# Patient Record
Sex: Female | Born: 1980 | Race: Asian | Hispanic: No | Marital: Married | Smoking: Never smoker
Health system: Southern US, Community
[De-identification: ages and names within clinical notes are randomized; demographics above are authoritative.]

## PROBLEM LIST (undated history)

## (undated) DIAGNOSIS — F809 Developmental disorder of speech and language, unspecified: Secondary | ICD-10-CM

## (undated) DIAGNOSIS — H9190 Unspecified hearing loss, unspecified ear: Secondary | ICD-10-CM

## (undated) DIAGNOSIS — R479 Unspecified speech disturbances: Secondary | ICD-10-CM

## (undated) HISTORY — PX: NO PAST SURGERIES: SHX2092

## (undated) HISTORY — DX: Unspecified hearing loss, unspecified ear: H91.90

---

## 2011-03-02 ENCOUNTER — Other Ambulatory Visit: Payer: Self-pay | Admitting: Otolaryngology

## 2011-03-29 ENCOUNTER — Ambulatory Visit
Admission: RE | Admit: 2011-03-29 | Discharge: 2011-03-29 | Disposition: A | Discharge status: Home / Self Care | Payer: Medicaid Other | Source: Ambulatory Visit | Attending: Otolaryngology | Admitting: Otolaryngology

## 2011-03-29 MED ORDER — GADOBENATE DIMEGLUMINE 529 MG/ML IV SOLN
9.0000 mL | Freq: Once | INTRAVENOUS | Status: AC | PRN
  Administered 2011-03-29: 9 mL via INTRAVENOUS

## 2011-06-29 NOTE — L&D Delivery Note (Signed)
Delivery Note At 12:12 AM a viable female was delivered via Vaginal, Spontaneous Delivery (Presentation: ; Occiput Anterior).  APGAR: 9, 9; weight 6 lb 4.3 oz (2843 g).   Placenta status: Intact, Spontaneous.  Cord: 3 vessels with the following complications: None.   Anesthesia: None  Episiotomy: None Lacerations: None Suture Repair: n/a Est. Blood Loss (mL):   Mom to postpartum.  Baby to nursery-stable.  CRESENZO-DISHMAN,Chelli Yerkes 11/14/2011, 12:26 AM

## 2011-08-18 ENCOUNTER — Other Ambulatory Visit (HOSPITAL_COMMUNITY): Payer: Self-pay | Admitting: Nurse Practitioner

## 2011-08-18 DIAGNOSIS — Z3689 Encounter for other specified antenatal screening: Secondary | ICD-10-CM

## 2011-08-18 LAB — OB RESULTS CONSOLE ABO/RH: RH Type: POSITIVE

## 2011-08-18 LAB — OB RESULTS CONSOLE RUBELLA ANTIBODY, IGM: Rubella: IMMUNE

## 2011-08-18 LAB — OB RESULTS CONSOLE RPR: RPR: NONREACTIVE

## 2011-08-24 ENCOUNTER — Ambulatory Visit (HOSPITAL_COMMUNITY)
Admission: RE | Admit: 2011-08-24 | Discharge: 2011-08-24 | Disposition: A | Discharge status: Home / Self Care | Payer: Medicaid Other | Source: Ambulatory Visit | Attending: Nurse Practitioner | Admitting: Nurse Practitioner

## 2011-08-24 DIAGNOSIS — Z363 Encounter for antenatal screening for malformations: Secondary | ICD-10-CM | POA: Insufficient documentation

## 2011-08-24 DIAGNOSIS — Z3689 Encounter for other specified antenatal screening: Secondary | ICD-10-CM

## 2011-08-24 DIAGNOSIS — Z1389 Encounter for screening for other disorder: Secondary | ICD-10-CM | POA: Insufficient documentation

## 2011-08-24 DIAGNOSIS — O0930 Supervision of pregnancy with insufficient antenatal care, unspecified trimester: Secondary | ICD-10-CM | POA: Insufficient documentation

## 2011-08-24 DIAGNOSIS — O358XX0 Maternal care for other (suspected) fetal abnormality and damage, not applicable or unspecified: Secondary | ICD-10-CM | POA: Insufficient documentation

## 2011-09-22 ENCOUNTER — Other Ambulatory Visit (HOSPITAL_COMMUNITY): Payer: Self-pay | Admitting: Physician Assistant

## 2011-09-22 DIAGNOSIS — Z1389 Encounter for screening for other disorder: Secondary | ICD-10-CM

## 2011-09-24 ENCOUNTER — Ambulatory Visit (HOSPITAL_COMMUNITY)
Admission: RE | Admit: 2011-09-24 | Discharge: 2011-09-24 | Disposition: A | Discharge status: Home / Self Care | Payer: Medicaid Other | Source: Ambulatory Visit | Attending: Physician Assistant | Admitting: Physician Assistant

## 2011-09-24 DIAGNOSIS — Z3689 Encounter for other specified antenatal screening: Secondary | ICD-10-CM | POA: Insufficient documentation

## 2011-09-24 DIAGNOSIS — O0930 Supervision of pregnancy with insufficient antenatal care, unspecified trimester: Secondary | ICD-10-CM | POA: Insufficient documentation

## 2011-09-24 DIAGNOSIS — Z1389 Encounter for screening for other disorder: Secondary | ICD-10-CM

## 2011-09-24 DIAGNOSIS — O99891 Other specified diseases and conditions complicating pregnancy: Secondary | ICD-10-CM | POA: Insufficient documentation

## 2011-10-06 ENCOUNTER — Other Ambulatory Visit (HOSPITAL_COMMUNITY): Payer: Self-pay | Admitting: Physician Assistant

## 2011-10-08 ENCOUNTER — Ambulatory Visit (HOSPITAL_COMMUNITY): Payer: Medicaid Other

## 2011-10-15 ENCOUNTER — Ambulatory Visit (HOSPITAL_COMMUNITY)
Admission: RE | Admit: 2011-10-15 | Discharge: 2011-10-15 | Disposition: A | Discharge status: Home / Self Care | Payer: Medicaid Other | Source: Ambulatory Visit | Attending: Physician Assistant | Admitting: Physician Assistant

## 2011-10-15 DIAGNOSIS — O365990 Maternal care for other known or suspected poor fetal growth, unspecified trimester, not applicable or unspecified: Secondary | ICD-10-CM | POA: Insufficient documentation

## 2011-10-15 DIAGNOSIS — Z3689 Encounter for other specified antenatal screening: Secondary | ICD-10-CM | POA: Insufficient documentation

## 2011-11-08 ENCOUNTER — Inpatient Hospital Stay (HOSPITAL_COMMUNITY): Admission: AD | Admit: 2011-11-08 | Payer: Self-pay | Source: Ambulatory Visit | Admitting: Family Medicine

## 2011-11-13 ENCOUNTER — Inpatient Hospital Stay (HOSPITAL_COMMUNITY)
Admission: AD | Admit: 2011-11-13 | Discharge: 2011-11-16 | DRG: 775 | Disposition: A | Discharge status: Home / Self Care | Payer: Medicaid Other | Source: Ambulatory Visit | Attending: Obstetrics & Gynecology | Admitting: Obstetrics & Gynecology

## 2011-11-13 ENCOUNTER — Encounter (HOSPITAL_COMMUNITY): Payer: Self-pay

## 2011-11-13 DIAGNOSIS — IMO0001 Reserved for inherently not codable concepts without codable children: Secondary | ICD-10-CM

## 2011-11-13 DIAGNOSIS — O99892 Other specified diseases and conditions complicating childbirth: Principal | ICD-10-CM | POA: Diagnosis present

## 2011-11-13 DIAGNOSIS — H913 Deaf nonspeaking, not elsewhere classified: Secondary | ICD-10-CM | POA: Diagnosis present

## 2011-11-13 LAB — CBC
Hemoglobin: 12.6 g/dL (ref 12.0–15.0)
MCH: 29.4 pg (ref 26.0–34.0)
Platelets: 194 10*3/uL (ref 150–400)
RBC: 4.29 MIL/uL (ref 3.87–5.11)
WBC: 8.1 10*3/uL (ref 4.0–10.5)

## 2011-11-13 MED ORDER — ONDANSETRON HCL 4 MG/2ML IJ SOLN: 4.0000 mg | Freq: Four times a day (QID) | INTRAMUSCULAR | Status: DC | PRN

## 2011-11-13 MED ORDER — ACETAMINOPHEN 325 MG PO TABS: 650.0000 mg | ORAL_TABLET | ORAL | Status: DC | PRN

## 2011-11-13 MED ORDER — FENTANYL CITRATE 0.05 MG/ML IJ SOLN
50.0000 ug | INTRAMUSCULAR | Status: DC | PRN
  Administered 2011-11-13: 50 ug via INTRAVENOUS
  Filled 2011-11-13: qty 2

## 2011-11-13 MED ORDER — LACTATED RINGERS IV SOLN: 500.0000 mL | INTRAVENOUS | Status: DC | PRN

## 2011-11-13 MED ORDER — HYDROXYZINE HCL 50 MG/ML IM SOLN: 50.0000 mg | Freq: Four times a day (QID) | INTRAMUSCULAR | Status: DC | PRN

## 2011-11-13 MED ORDER — LACTATED RINGERS IV SOLN: INTRAVENOUS | Status: DC

## 2011-11-13 MED ORDER — OXYTOCIN 20 UNITS IN LACTATED RINGERS INFUSION - SIMPLE
125.0000 mL/h | Freq: Once | INTRAVENOUS | Status: AC
  Administered 2011-11-14: 500 mL/h via INTRAVENOUS

## 2011-11-13 MED ORDER — FLEET ENEMA 7-19 GM/118ML RE ENEM: 1.0000 | ENEMA | RECTAL | Status: DC | PRN

## 2011-11-13 MED ORDER — LIDOCAINE HCL (PF) 1 % IJ SOLN: 30.0000 mL | INTRAMUSCULAR | Status: DC | PRN

## 2011-11-13 MED ORDER — IBUPROFEN 600 MG PO TABS: 600.0000 mg | ORAL_TABLET | Freq: Four times a day (QID) | ORAL | Status: DC | PRN

## 2011-11-13 MED ORDER — CITRIC ACID-SODIUM CITRATE 334-500 MG/5ML PO SOLN: 30.0000 mL | ORAL | Status: DC | PRN

## 2011-11-13 MED ORDER — HYDROXYZINE HCL 50 MG PO TABS: 50.0000 mg | ORAL_TABLET | Freq: Four times a day (QID) | ORAL | Status: DC | PRN

## 2011-11-13 MED ORDER — OXYCODONE-ACETAMINOPHEN 5-325 MG PO TABS: 1.0000 | ORAL_TABLET | ORAL | Status: DC | PRN

## 2011-11-13 MED ORDER — OXYTOCIN BOLUS FROM INFUSION
500.0000 mL | Freq: Once | INTRAVENOUS | Status: DC
  Filled 2011-11-13: qty 500
  Filled 2011-11-13: qty 1000

## 2011-11-13 NOTE — MAU Note (Signed)
Pt c/o ucs since 1700. Pt deaf and family members speak Nepali.

## 2011-11-13 NOTE — Progress Notes (Signed)
   Samantha Garner is a 31 y.o. G3P2002 at [redacted]w[redacted]d  admitted for active labor  Subjective: Wants some IV pain meds  Objective: BP 121/82  Pulse 80  Temp(Src) 98.9 F (37.2 C) (Oral)  Resp 18  Ht 5\' 2"  (1.575 m)  Wt 70.308 kg (155 lb)  BMI 28.35 kg/m2  SpO2 100%    FHT:  FHR: 130 bpm, variability: moderate,  accelerations:  Present,  decelerations:  Absent UC:   regular, every 4-5 minutes SVE:   Dilation: 6 Effacement (%): 90 Station: -2 Exam by:: F.Cresenzo,CNM  Labs: Lab Results  Component Value Date   WBC 8.1 11/13/2011   HGB 12.6 11/13/2011   HCT 36.5 11/13/2011   MCV 85.1 11/13/2011   PLT 194 11/13/2011   AROM with clear fluid Assessment / Plan: Spontaneous labor, progressing normally  Labor: Progressing normally Fetal Wellbeing:  Category I Pain Control:  Fentanyl Anticipated MOD:  NSVD  CRESENZO-DISHMAN,Kajal Scalici 11/13/2011, 11:25 PM

## 2011-11-13 NOTE — H&P (Signed)
  Samantha Garner is a 31 y.o. female G3P2002 with IUP at [redacted]w[redacted]d presenting for contractions for several hours. Pt states she has been having regular, every 5 minutes contractions, associated with none vaginal bleeding, membranes are intact, with active fetal movement.   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  From [place]  Past Surgical History: Past Surgical History  Procedure Date  . No past surgeries     Obstetrical History: OB History    Grav Para Term Preterm Abortions TAB SAB Ect Mult Living   3 2 2  0 0 0 0 0 0 2      Gynecological History: OB History    Grav Para Term Preterm Abortions TAB SAB Ect Mult Living   3 2 2  0 0 0 0 0 0 2      Social History: History   Social History  . Marital Status: Married    Spouse Name: N/A    Number of Children: N/A  . Years of Education: N/A   Social History Main Topics  . Smoking status: None  . Smokeless tobacco: None  . Alcohol Use:   . Drug Use:   . Sexually Active: Yes   Other Topics Concern  . None   Social History Narrative  . None    Family History: History reviewed. No pertinent family history.  Allergies: Allergies not on file  No prescriptions prior to admission    Review of Systems - Negative except contractions   Blood pressure 119/72, pulse 91, temperature 98.2 F (36.8 C), temperature source Oral, resp. rate 16, height 5\' 2"  (1.575 m), weight 70.308 kg (155 lb), SpO2 100.00%. General appearance: alert, cooperative and no distress Lungs: clear to auscultation bilaterally Heart: regular rate and rhythm Abdomen: soft, non-tender; bowel sounds normal Extremities: Homans sign is negative, no sign of DVT DTR's 2+ Pelvic:  4-5/80/-2/BBOW Presentation: cephalic Fetal monitoringBaseline: 130 bpm, Variability: Good {> 6 bpm), Accelerations: Reactive and Decelerations: Absent Uterine  activity:  Contractions q 3-5 minutes     Prenatal labs: ABO, Rh:  O+ Antibody:  neg Rubella:  immune RPR:   neg HBsAg:   neg HIV:   neg GBS:   neg 1 hr Glucola 58 Genetic screening  Too late Anatomy US normal   Assessment: Samantha Garner is a 31 y.o. [id number] with an IUP at [redacted]w[redacted]d presenting for active labor  Plan: Admit for labor management   CRESENZO-DISHMAN,Kailoni Vahle 11/13/2011, 9:29 PM

## 2011-11-14 ENCOUNTER — Encounter (HOSPITAL_COMMUNITY): Payer: Self-pay | Admitting: *Deleted

## 2011-11-14 DIAGNOSIS — O9989 Other specified diseases and conditions complicating pregnancy, childbirth and the puerperium: Secondary | ICD-10-CM

## 2011-11-14 DIAGNOSIS — H913 Deaf nonspeaking, not elsewhere classified: Secondary | ICD-10-CM

## 2011-11-14 MED ORDER — BENZOCAINE-MENTHOL 20-0.5 % EX AERO: 1.0000 "application " | INHALATION_SPRAY | CUTANEOUS | Status: DC | PRN

## 2011-11-14 MED ORDER — LANOLIN HYDROUS EX OINT: TOPICAL_OINTMENT | CUTANEOUS | Status: DC | PRN

## 2011-11-14 MED ORDER — PRENATAL MULTIVITAMIN CH
1.0000 | ORAL_TABLET | Freq: Every day | ORAL | Status: DC
  Administered 2011-11-14 – 2011-11-16 (×3): 1 via ORAL
  Filled 2011-11-14 (×3): qty 1

## 2011-11-14 MED ORDER — WITCH HAZEL-GLYCERIN EX PADS: 1.0000 "application " | MEDICATED_PAD | CUTANEOUS | Status: DC | PRN

## 2011-11-14 MED ORDER — FERROUS SULFATE 325 (65 FE) MG PO TABS
325.0000 mg | ORAL_TABLET | Freq: Two times a day (BID) | ORAL | Status: DC
  Administered 2011-11-14 – 2011-11-16 (×5): 325 mg via ORAL
  Filled 2011-11-14 (×5): qty 1

## 2011-11-14 MED ORDER — METHYLERGONOVINE MALEATE 0.2 MG/ML IJ SOLN: 0.2000 mg | INTRAMUSCULAR | Status: DC | PRN

## 2011-11-14 MED ORDER — MEASLES, MUMPS & RUBELLA VAC ~~LOC~~ INJ
0.5000 mL | INJECTION | Freq: Once | SUBCUTANEOUS | Status: DC
  Filled 2011-11-14: qty 0.5

## 2011-11-14 MED ORDER — OXYTOCIN 20 UNITS IN LACTATED RINGERS INFUSION - SIMPLE: 125.0000 mL/h | INTRAVENOUS | Status: DC | PRN

## 2011-11-14 MED ORDER — TETANUS-DIPHTH-ACELL PERTUSSIS 5-2.5-18.5 LF-MCG/0.5 IM SUSP: 0.5000 mL | Freq: Once | INTRAMUSCULAR | Status: DC

## 2011-11-14 MED ORDER — IBUPROFEN 600 MG PO TABS
600.0000 mg | ORAL_TABLET | Freq: Four times a day (QID) | ORAL | Status: DC
  Administered 2011-11-14 – 2011-11-16 (×10): 600 mg via ORAL
  Filled 2011-11-14 (×10): qty 1

## 2011-11-14 MED ORDER — SENNOSIDES-DOCUSATE SODIUM 8.6-50 MG PO TABS
2.0000 | ORAL_TABLET | Freq: Every day | ORAL | Status: DC
  Administered 2011-11-14 – 2011-11-15 (×2): 2 via ORAL

## 2011-11-14 MED ORDER — DIPHENHYDRAMINE HCL 25 MG PO CAPS: 25.0000 mg | ORAL_CAPSULE | Freq: Four times a day (QID) | ORAL | Status: DC | PRN

## 2011-11-14 MED ORDER — ONDANSETRON HCL 4 MG/2ML IJ SOLN: 4.0000 mg | INTRAMUSCULAR | Status: DC | PRN

## 2011-11-14 MED ORDER — ONDANSETRON HCL 4 MG PO TABS: 4.0000 mg | ORAL_TABLET | ORAL | Status: DC | PRN

## 2011-11-14 MED ORDER — MAGNESIUM HYDROXIDE 400 MG/5ML PO SUSP: 30.0000 mL | ORAL | Status: DC | PRN

## 2011-11-14 MED ORDER — ZOLPIDEM TARTRATE 5 MG PO TABS: 5.0000 mg | ORAL_TABLET | Freq: Every evening | ORAL | Status: DC | PRN

## 2011-11-14 MED ORDER — OXYCODONE-ACETAMINOPHEN 5-325 MG PO TABS: 1.0000 | ORAL_TABLET | ORAL | Status: DC | PRN

## 2011-11-14 MED ORDER — DIBUCAINE 1 % RE OINT: 1.0000 "application " | TOPICAL_OINTMENT | RECTAL | Status: DC | PRN

## 2011-11-14 MED ORDER — METHYLERGONOVINE MALEATE 0.2 MG PO TABS: 0.2000 mg | ORAL_TABLET | ORAL | Status: DC | PRN

## 2011-11-14 MED ORDER — SIMETHICONE 80 MG PO CHEW: 80.0000 mg | CHEWABLE_TABLET | ORAL | Status: DC | PRN

## 2011-11-14 NOTE — Progress Notes (Signed)
Pt transferred to room 122 via wheelchair, mother in law at side.

## 2011-11-14 NOTE — Progress Notes (Signed)
Patient ID: Samantha Garner, female   DOB: [birth year], 31 y.o.   MRN: [id number] Attempted to communicate with patient who makes poor eye contact. Family members at bedside reports that patient speaks Nepali but due to her hearing impairment her speech is not well developed. Explained to family members that a tubal ligation cannot be performed under these circumstances. Explained that patient would need to communicate for herself through a Nepali interpreter or sign language and not through family members to express her desires for permanent sterilization. Family members were understanding. Explained non-permanent forms of birth control available at health department including IUD. Family members seemed very responsive to that. Again explained that patient will need to express her choice of preferred birth control method.

## 2011-11-15 LAB — CBC
HCT: 38.2 % (ref 36.0–46.0)
Hemoglobin: 12.5 g/dL (ref 12.0–15.0)
MCHC: 32.7 g/dL (ref 30.0–36.0)
WBC: 8.6 10*3/uL (ref 4.0–10.5)

## 2011-11-15 NOTE — Progress Notes (Signed)
UR chart review completed.  

## 2011-11-15 NOTE — Progress Notes (Signed)
Post Partum Day 2 Subjective: no complaints, up ad lib, voiding, tolerating PO and + flatus  Objective: Blood pressure 99/62, pulse 66, temperature 98.5 F (36.9 C), temperature source Oral, resp. rate 18, height 5\' 2"  (1.575 m), weight 155 lb (70.308 kg), SpO2 98.00%, unknown if currently breastfeeding.  Physical Exam:  General: alert, cooperative, appears stated age and no distress Lochia: appropriate Uterine Fundus: firm Incision: n/a DVT Evaluation: No evidence of DVT seen on physical exam. Negative Homan's sign. No cords or calf tenderness. No significant calf/ankle edema.   Basename 11/15/11 0528 11/13/11 2210  HGB 12.5 12.6  HCT 38.2 36.5    Assessment/Plan: Plan for discharge tomorrow   LOS: 2 days   Samantha Garner DARLENE 11/15/2011, 6:04 AM

## 2011-11-16 MED ORDER — IBUPROFEN 600 MG PO TABS: 600.0000 mg | ORAL_TABLET | Freq: Four times a day (QID) | ORAL | Status: AC

## 2011-11-16 NOTE — Progress Notes (Signed)
Post Partum Day #2   Subjective: no complaints, up ad lib, voiding, tolerating PO and + flatus Currently sitting in bed breast feeding.  Per RN report, patient is urinating well and ambulating.   Attempted communication with actions.  Patient does not appear to be in pain. Mother-in-law present in room; she states that they do not have a ride to go home today but per RN, she told the interpreter yesterday that they would have a ride this morning.   RN has requested interpreter to come from [facility] so that we can effectively communicate with patient about discharge instructions, ride home, possible IUD placement and any questions the patient may have.  RN will call Faculty resident/CNM when interpreter is present.    Objective: Blood pressure 114/76, pulse 71, temperature 98.3 F (36.8 C), temperature source Oral, resp. rate 18, height 5\' 2"  (1.575 m), weight 70.308 kg (155 lb), SpO2 96.00%, unknown if currently breastfeeding.  Physical Exam:  General: alert, cooperative, no distress and unable to verbalize any concerns CV:  RRR, no MRG. 2+ radial, 2+ PT pulses bilaterally.  Lungs:  Clear to auscultation, no wheezing, rales or rhonchi.  Abdomen: soft, no distension Lochia: appropriate Uterine Fundus: firm, below umbilicus  DVT Evaluation: No evidence of DVT seen on physical exam.  No calf tenderness with palpation.  No significant calf/ankle edema.   Basename 11/15/11 0528 11/13/11 2210  HGB 12.5 12.6  HCT 38.2 36.5    Assessment/Plan: Discharge home Will plan for d/c once interpreter is able to help providers and other medical staff communicate with the patient.     LOS: 3 days   Bedelia Person, PA-S 11/16/2011, 7:39 AM

## 2011-11-16 NOTE — Progress Notes (Signed)
I have seen and examined this patient and I agree with the above. See also DC summary by me. Cam Hai 11:52 AM 11/16/2011

## 2011-11-16 NOTE — Discharge Summary (Signed)
Obstetric Discharge Summary Reason for Admission: onset of labor Prenatal Procedures: none Intrapartum Procedures: spontaneous vaginal delivery Postpartum Procedures: none Complications-Operative and Postpartum: none Hemoglobin  Date Value Range Status  11/15/2011 12.5  12.0-15.0 (g/dL) Final     HCT  Date Value Range Status  11/15/2011 38.2  36.0-46.0 (%) Final    Physical Exam:  General: alert, cooperative and no distress Lochia: appropriate Uterine Fundus: firm DVT Evaluation: No evidence of DVT seen on physical exam.  Discharge Diagnoses: Term Pregnancy-delivered  Discharge Information: Date: 11/16/2011 Activity: pelvic rest Diet: routine Medications: PNV and Ibuprofen Condition: stable Instructions: refer to practice specific booklet Discharge to: home Follow-up Information    Follow up with [facility]. (Your appointment is on December 15, 2011 at 12:45pm)    Contact information:   [address] [place] [address]          Newborn Data: Live born female  Birth Weight: 6 lb 4.3 oz (2843 g) APGAR: 9, 9  Home with mother. Pt is breastfeeding. She desires BTL. Spoke with K Rassette who will arrange signing of 30-day papers today and pt to have a PP/pre-op visit on December 15, 2011 at 12:45p. All communication thru [employer] from [employer] ([place] dialect).  Cam Hai 11/16/2011, 11:55 AM

## 2011-11-16 NOTE — Discharge Instructions (Signed)
Vaginal Delivery Care After  Change your pad on each trip to the bathroom.   Wipe gently with toilet paper during your hospital stay. Always wipe from front to back. A spray bottle with warm tap water could also be used or a towelette if available.   Place your soiled pad and toilet paper in a bathroom wastebasket with a plastic bag liner.   During your hospital stay, save any clots. If you pass a clot while on the toilet, do not flush it. Also, if your vaginal flow seems excessive to you, notify nursing personnel.   The first time you get out of bed after delivery, wait for assistance from a nurse. Do not get up alone at any time if you feel weak or dizzy.   Bend and extend your ankles forcefully so that you feel the calves of your legs get hard. Do this 6 times every hour when you are in bed and awake.   Do not sit with one foot under you, dangle your legs over the edge of the bed, or maintain a position that hinders the circulation in your legs.   Many women experience after pains for 2 to 3 days after delivery. These after pains are mild uterine contractions. Ask the nurse for a pain medication if you need something for this. Sometimes breastfeeding stimulates after pains; if you find this to be true, ask for the medication  -  hour before the next feeding.   For you and your infant's protection, do not go beyond the door(s) of the obstetric unit. Do not carry your baby in your arms in the hallway. When taking your baby to and from your room, put your baby in the bassinet and push the bassinet.   Mothers may have their babies in their room as much as they desire.  Document Released: 06/11/2000 Document Revised: 06/03/2011 Document Reviewed: 05/12/2007 ExitCare Patient Information 2012 [employer]. 

## 2011-12-15 ENCOUNTER — Ambulatory Visit: Payer: Medicaid Other | Admitting: Obstetrics and Gynecology

## 2012-01-12 ENCOUNTER — Ambulatory Visit (INDEPENDENT_AMBULATORY_CARE_PROVIDER_SITE_OTHER): Payer: Medicaid Other | Admitting: Obstetrics & Gynecology

## 2012-01-12 ENCOUNTER — Encounter: Payer: Self-pay | Admitting: Obstetrics & Gynecology

## 2012-01-12 NOTE — Progress Notes (Signed)
[employer] interpreter # [phone]  used for visit.   Pt clearly stated via interpreter that she does not want any more children. She states that she already has 2 sons and this new baby and she desires permanent sterilization.  Pt denies sx of PP depression. She denies crying, feelings of sadness, difficulty sleeping or desire to harm herself.

## 2012-02-24 ENCOUNTER — Encounter (HOSPITAL_COMMUNITY): Payer: Self-pay

## 2012-03-03 ENCOUNTER — Encounter (HOSPITAL_COMMUNITY): Payer: Self-pay

## 2012-03-03 ENCOUNTER — Encounter (HOSPITAL_COMMUNITY)
Admission: RE | Admit: 2012-03-03 | Discharge: 2012-03-03 | Disposition: A | Discharge status: Home / Self Care | Payer: Medicaid Other | Source: Ambulatory Visit | Attending: Obstetrics & Gynecology | Admitting: Obstetrics & Gynecology

## 2012-03-03 HISTORY — DX: Unspecified speech disturbances: R47.9

## 2012-03-03 HISTORY — DX: Developmental disorder of speech and language, unspecified: F80.9

## 2012-03-03 LAB — CBC
MCH: 28.2 pg (ref 26.0–34.0)
MCHC: 33.6 g/dL (ref 30.0–36.0)
MCV: 83.9 fL (ref 78.0–100.0)
Platelets: 192 10*3/uL (ref 150–400)
RDW: 12.8 % (ref 11.5–15.5)

## 2012-03-03 NOTE — Patient Instructions (Addendum)
Samantha Garner  03/03/2012   Your procedure is scheduled on:  03/07/12  Enter through the Main Entrance of [facility] at 8 AM.  Pick up the phone at the desk and dial [phone].   Call this number if you have problems the morning of surgery: [phone]   Remember:   Do not eat food:After Midnight.  Do not drink clear liquids: After Midnight.  Take these medicines the morning of surgery with A SIP OF WATER: NA   Do not wear jewelry, make-up or nail polish.  Do not wear lotions, powders, or perfumes. You may wear deodorant.  Do not shave 48 hours prior to surgery.  Do not bring valuables to the hospital.  Contacts, dentures or bridgework may not be worn into surgery.  Leave suitcase in the car. After surgery it may be brought to your room.  For patients admitted to the hospital, checkout time is 11:00 AM the day of discharge.   Patients discharged the day of surgery will not be allowed to drive home.  Name and phone number of your driver: Undecided  Special Instructions: CHG Shower Use Special Wash: 1/2 bottle night before surgery and 1/2 bottle morning of surgery.   Please read over the following fact sheets that you were given: MRSA Information

## 2012-03-03 NOTE — Pre-Procedure Instructions (Signed)
Request for interpreter (for surgery) faxed to Social Work office; [phone]

## 2012-03-07 ENCOUNTER — Encounter (HOSPITAL_COMMUNITY): Payer: Self-pay | Admitting: Anesthesiology

## 2012-03-07 ENCOUNTER — Ambulatory Visit (HOSPITAL_COMMUNITY): Payer: Medicaid Other | Admitting: Anesthesiology

## 2012-03-07 ENCOUNTER — Ambulatory Visit (HOSPITAL_COMMUNITY)
Admission: RE | Admit: 2012-03-07 | Discharge: 2012-03-07 | Disposition: A | Discharge status: Home / Self Care | Payer: Medicaid Other | Source: Ambulatory Visit | Attending: Obstetrics & Gynecology | Admitting: Obstetrics & Gynecology

## 2012-03-07 ENCOUNTER — Encounter (HOSPITAL_COMMUNITY)
Admission: RE | Disposition: A | Discharge status: Home / Self Care | Payer: Self-pay | Source: Ambulatory Visit | Attending: Obstetrics & Gynecology

## 2012-03-07 DIAGNOSIS — Z01812 Encounter for preprocedural laboratory examination: Secondary | ICD-10-CM | POA: Insufficient documentation

## 2012-03-07 DIAGNOSIS — Z302 Encounter for sterilization: Secondary | ICD-10-CM

## 2012-03-07 HISTORY — PX: LAPAROSCOPIC TUBAL LIGATION: SHX1937

## 2012-03-07 SURGERY — LIGATION, FALLOPIAN TUBE, LAPAROSCOPIC
Anesthesia: Choice | Site: Abdomen | Laterality: Bilateral

## 2012-03-07 MED ORDER — NEOSTIGMINE METHYLSULFATE 1 MG/ML IJ SOLN
INTRAMUSCULAR | Status: DC | PRN
  Administered 2012-03-07: 2 mg via INTRAVENOUS

## 2012-03-07 MED ORDER — MIDAZOLAM HCL 5 MG/5ML IJ SOLN
INTRAMUSCULAR | Status: DC | PRN
  Administered 2012-03-07: 2 mg via INTRAVENOUS

## 2012-03-07 MED ORDER — HYDROCODONE-ACETAMINOPHEN 5-500 MG PO TABS: 1.0000 | ORAL_TABLET | Freq: Four times a day (QID) | ORAL | Status: AC | PRN

## 2012-03-07 MED ORDER — ONDANSETRON HCL 4 MG/2ML IJ SOLN
INTRAMUSCULAR | Status: AC
  Filled 2012-03-07: qty 2

## 2012-03-07 MED ORDER — PROPOFOL 10 MG/ML IV EMUL
INTRAVENOUS | Status: AC
  Filled 2012-03-07: qty 20

## 2012-03-07 MED ORDER — PROPOFOL 10 MG/ML IV BOLUS
INTRAVENOUS | Status: DC | PRN
  Administered 2012-03-07: 150 mg via INTRAVENOUS

## 2012-03-07 MED ORDER — MIDAZOLAM HCL 2 MG/2ML IJ SOLN
INTRAMUSCULAR | Status: AC
  Filled 2012-03-07: qty 2

## 2012-03-07 MED ORDER — BUPIVACAINE HCL (PF) 0.5 % IJ SOLN
INTRAMUSCULAR | Status: DC | PRN
  Administered 2012-03-07: 10 mL

## 2012-03-07 MED ORDER — BUPIVACAINE HCL (PF) 0.25 % IJ SOLN
INTRAMUSCULAR | Status: AC
  Filled 2012-03-07: qty 30

## 2012-03-07 MED ORDER — KETOROLAC TROMETHAMINE 30 MG/ML IJ SOLN
INTRAMUSCULAR | Status: AC
  Filled 2012-03-07: qty 1

## 2012-03-07 MED ORDER — DEXAMETHASONE SODIUM PHOSPHATE 10 MG/ML IJ SOLN
INTRAMUSCULAR | Status: DC | PRN
  Administered 2012-03-07: 10 mg via INTRAVENOUS

## 2012-03-07 MED ORDER — FENTANYL CITRATE 0.05 MG/ML IJ SOLN
INTRAMUSCULAR | Status: DC | PRN
  Administered 2012-03-07: 150 ug via INTRAVENOUS
  Administered 2012-03-07: 100 ug via INTRAVENOUS

## 2012-03-07 MED ORDER — KETOROLAC TROMETHAMINE 30 MG/ML IJ SOLN
INTRAMUSCULAR | Status: DC | PRN
  Administered 2012-03-07: 30 mg via INTRAVENOUS

## 2012-03-07 MED ORDER — LACTATED RINGERS IV SOLN
INTRAVENOUS | Status: DC
  Administered 2012-03-07 (×2): via INTRAVENOUS

## 2012-03-07 MED ORDER — DEXAMETHASONE SODIUM PHOSPHATE 10 MG/ML IJ SOLN
INTRAMUSCULAR | Status: AC
  Filled 2012-03-07: qty 1

## 2012-03-07 MED ORDER — LIDOCAINE HCL (CARDIAC) 20 MG/ML IV SOLN
INTRAVENOUS | Status: DC | PRN
  Administered 2012-03-07: 40 mg via INTRAVENOUS

## 2012-03-07 MED ORDER — ROCURONIUM BROMIDE 50 MG/5ML IV SOLN
INTRAVENOUS | Status: AC
  Filled 2012-03-07: qty 1

## 2012-03-07 MED ORDER — NEOSTIGMINE METHYLSULFATE 1 MG/ML IJ SOLN
INTRAMUSCULAR | Status: AC
  Filled 2012-03-07: qty 10

## 2012-03-07 MED ORDER — GLYCOPYRROLATE 0.2 MG/ML IJ SOLN
INTRAMUSCULAR | Status: DC | PRN
  Administered 2012-03-07: 0.4 mg via INTRAVENOUS

## 2012-03-07 MED ORDER — ONDANSETRON HCL 4 MG/2ML IJ SOLN: 4.0000 mg | Freq: Once | INTRAMUSCULAR | Status: DC | PRN

## 2012-03-07 MED ORDER — LIDOCAINE HCL (CARDIAC) 20 MG/ML IV SOLN
INTRAVENOUS | Status: AC
  Filled 2012-03-07: qty 5

## 2012-03-07 MED ORDER — MEPERIDINE HCL 25 MG/ML IJ SOLN: 6.2500 mg | INTRAMUSCULAR | Status: DC | PRN

## 2012-03-07 MED ORDER — ROCURONIUM BROMIDE 100 MG/10ML IV SOLN
INTRAVENOUS | Status: DC | PRN
  Administered 2012-03-07: 20 mg via INTRAVENOUS

## 2012-03-07 MED ORDER — ONDANSETRON HCL 4 MG/2ML IJ SOLN
INTRAMUSCULAR | Status: DC | PRN
  Administered 2012-03-07: 4 mg via INTRAVENOUS

## 2012-03-07 MED ORDER — CEFAZOLIN SODIUM-DEXTROSE 2-3 GM-% IV SOLR
INTRAVENOUS | Status: AC
  Filled 2012-03-07: qty 50

## 2012-03-07 MED ORDER — FENTANYL CITRATE 0.05 MG/ML IJ SOLN
INTRAMUSCULAR | Status: AC
  Filled 2012-03-07: qty 5

## 2012-03-07 MED ORDER — GLYCOPYRROLATE 0.2 MG/ML IJ SOLN
INTRAMUSCULAR | Status: AC
  Filled 2012-03-07: qty 2

## 2012-03-07 MED ORDER — KETOROLAC TROMETHAMINE 30 MG/ML IJ SOLN: 15.0000 mg | Freq: Once | INTRAMUSCULAR | Status: DC | PRN

## 2012-03-07 MED ORDER — FENTANYL CITRATE 0.05 MG/ML IJ SOLN: 25.0000 ug | INTRAMUSCULAR | Status: DC | PRN

## 2012-03-07 MED ORDER — CEFAZOLIN SODIUM-DEXTROSE 2-3 GM-% IV SOLR
2.0000 g | INTRAVENOUS | Status: AC
  Administered 2012-03-07: 2 g via INTRAVENOUS

## 2012-03-07 SURGICAL SUPPLY — 20 items
CABLE HIGH FREQUENCY MONO STRZ (ELECTRODE) IMPLANT
CATH ROBINSON RED A/P 16FR (CATHETERS) ×2 IMPLANT
CHLORAPREP W/TINT 26ML (MISCELLANEOUS) ×2 IMPLANT
CLIP FILSHIE TUBAL LIGA STRL (Clip) ×2 IMPLANT
CLOTH BEACON ORANGE TIMEOUT ST (SAFETY) ×2 IMPLANT
DRSG COVADERM PLUS 2X2 (GAUZE/BANDAGES/DRESSINGS) ×2 IMPLANT
ELECT REM PT RETURN 9FT ADLT (ELECTROSURGICAL)
ELECTRODE REM PT RTRN 9FT ADLT (ELECTROSURGICAL) IMPLANT
GLOVE BIO SURGEON STRL SZ 6.5 (GLOVE) ×4 IMPLANT
GOWN PREVENTION PLUS LG XLONG (DISPOSABLE) ×4 IMPLANT
NDL SAFETY ECLIPSE 18X1.5 (NEEDLE) ×1 IMPLANT
NEEDLE HYPO 18GX1.5 SHARP (NEEDLE) ×1
NEEDLE INSUFFLATION 14GA 120MM (NEEDLE) ×2 IMPLANT
PACK LAPAROSCOPY BASIN (CUSTOM PROCEDURE TRAY) ×2 IMPLANT
SUT VICRYL 0 UR6 27IN ABS (SUTURE) IMPLANT
SUT VICRYL 4-0 PS2 18IN ABS (SUTURE) ×2 IMPLANT
TOWEL OR 17X24 6PK STRL BLUE (TOWEL DISPOSABLE) ×4 IMPLANT
TROCAR XCEL NON-BLD 11X100MML (ENDOMECHANICALS) IMPLANT
TROCAR XCEL NON-BLD 5MMX100MML (ENDOMECHANICALS) IMPLANT
WATER STERILE IRR 1000ML POUR (IV SOLUTION) ×2 IMPLANT

## 2012-03-07 NOTE — Anesthesia Postprocedure Evaluation (Signed)
Anesthesia Post Note  Patient: Samantha Garner  Procedure(s) Performed: Procedure(s) (LRB): LAPAROSCOPIC TUBAL LIGATION (Bilateral)  Anesthesia type: General  Patient location: PACU  Post pain: Pain level controlled  Post assessment: Post-op Vital signs reviewed  Last Vitals:  Filed Vitals:   03/07/12 1215  BP: 106/63  Pulse: 54  Temp:   Resp: 19    Post vital signs: Reviewed  Level of consciousness: sedated  Complications: No apparent anesthesia complicationsfj

## 2012-03-07 NOTE — H&P (Signed)
  31 yo M  Married lady P3 from [place] who is here today for a tubal ligation. She declines alternative forms of birth control.  PMH- none PSH- none NKDA SH- no tobacco, etoh, drugs ROS- married for 15 years, homemaker FH- no breast, gyn, colon cancer  PE [facility] F NAD  Heart- rrr Lungs- CTAB Abd- benign  A/P. She desires sterility in the form of a tubal ligation. She understands the risks of surgery as well as the failure rate. She declines alternative forms of birth control.

## 2012-03-07 NOTE — Transfer of Care (Signed)
Immediate Anesthesia Transfer of Care Note  Patient: Samantha Garner  Procedure(s) Performed: Procedure(s) (LRB) with comments: LAPAROSCOPIC TUBAL LIGATION (Bilateral)  Patient Location: PACU  Anesthesia Type: General  Level of Consciousness: sedated  Airway & Oxygen Therapy: Patient Spontanous Breathing and Patient connected to nasal cannula oxygen  Post-op Assessment: Report given to PACU RN and Post -op Vital signs reviewed and stable  Post vital signs: stable  Complications: No apparent anesthesia complications

## 2012-03-07 NOTE — Anesthesia Preprocedure Evaluation (Signed)
Anesthesia Evaluation  Patient identified by MRN, date of birth, ID band Patient awake    Reviewed: Allergy & Precautions, H&P , NPO status , Patient's Chart, lab work & pertinent test results  Airway Mallampati: I TM Distance: >3 FB Neck ROM: full    Dental No notable dental hx. (+) Teeth Intact   Pulmonary neg pulmonary ROS,    Pulmonary exam normal       Cardiovascular negative cardio ROS      Neuro/Psych negative neurological ROS  negative psych ROS   GI/Hepatic negative GI ROS, Neg liver ROS,   Endo/Other  negative endocrine ROS  Renal/GU negative Renal ROS  negative genitourinary   Musculoskeletal negative musculoskeletal ROS (+)   Abdominal Normal abdominal exam  (+)   Peds negative pediatric ROS (+)  Hematology negative hematology ROS (+)   Anesthesia Other Findings   Reproductive/Obstetrics negative OB ROS                           Anesthesia Physical Anesthesia Plan  ASA: I  Anesthesia Plan: General   Post-op Pain Management:    Induction: Intravenous  Airway Management Planned: Oral ETT  Additional Equipment:   Intra-op Plan:   Post-operative Plan: Extubation in OR  Informed Consent: I have reviewed the patients History and Physical, chart, labs and discussed the procedure including the risks, benefits and alternatives for the proposed anesthesia with the patient or authorized representative who has indicated his/her understanding and acceptance.   Dental Advisory Given  Plan Discussed with: CRNA and Surgeon  Anesthesia Plan Comments:         Anesthesia Quick Evaluation  

## 2012-03-07 NOTE — Op Note (Signed)
03/07/2012  11:12 AM  PATIENT:  Samantha Garner  31 y.o. female  PRE-OPERATIVE DIAGNOSIS:  Desires sterilization  POST-OPERATIVE DIAGNOSIS:  same  PROCEDURE:  Procedure(s) (LRB) with comments: LAPAROSCOPIC TUBAL LIGATION (Bilateral)  SURGEON:  Surgeon(s) and Role:    * Allie Bossier, MD - Primary  PHYSICIAN ASSISTANT:   ASSISTANTS: none   ANESTHESIA:   general  EBL:  Total I/O In: 1000 [I.V.:1000] Out: 155 [Urine:150; Blood:5]  BLOOD ADMINISTERED:none  DRAINS: none   LOCAL MEDICATIONS USED:  MARCAINE     SPECIMEN:  No Specimen  DISPOSITION OF SPECIMEN:  N/A  COUNTS:  YES  TOURNIQUET:  * No tourniquets in log *  DICTATION: .Dragon Dictation  PLAN OF CARE: Discharge to home after PACU  PATIENT DISPOSITION:  PACU - hemodynamically stable.   Delay start of Pharmacological VTE agent (>24hrs) due to surgical blood loss or risk of bleeding: not applicable  With the help of an interpreter, the risks benefits, alternatives of surgery were explained understood, accepted. All questions were answered. She understands the failure rate. She declines alternative forms of birth control. Her urine pregnancy test was negative. She was taken to the operating room and general anesthesia was applied without complication. She was placed in dorsal lithotomy position. Her abdomen and vagina were prepped and draped in the usual sterile fashion. A Robinson catheter was used to drain her bladder. A bimanual exam revealed a normal, size and shape mobile uterus with nonenlarged adnexa. A Hulka manipulator was placed on the cervix. Gloves were changed and attention was turned to the abdomen. Approximately 10 mL of 0.25% Marcaine was used to infiltrate the subcutaneous tissue at the umbilicus. A vertical 1 cm incision was made. A varies needle was placed in the pelvis. Low-flow CO2 was used to insufflate the abdomen to approximately 3 L. After good pneumoperitoneum was established, a 12 mm trocar was  placed. Laparoscopy confirmed correct placement. She was placed in the Trendelenburg position. Patient abdominal pressure was always less than 15. Her uterus ovaries and tubes appeared normal. There was no evidence of endometriosis. The oviducts were visually traced to their fimbriated end on each side. In the isthmic region of each oviduct a Filshie clip was placed across the entire oviduct. There was no bleeding. The CO2 was allowed to escape from her abdomen. The umbilical fascia was closed with a figure of 8-0 Vicryl suture. No defects were palpable. The subcuticular closure was done with 4-0 Vicryl suture. The Hulka manipulator was removed. She was extubated and taken to recovery in stable condition. She tolerated the procedure well. The instrument, sponge, and needle counts were correct.

## 2012-03-08 ENCOUNTER — Encounter (HOSPITAL_COMMUNITY): Payer: Self-pay | Admitting: Obstetrics & Gynecology

## 2012-12-01 ENCOUNTER — Ambulatory Visit: Payer: Medicaid Other | Admitting: Obstetrics and Gynecology

## 2013-08-12 IMAGING — US US OB FOLLOW-UP
1 series · 12 of 28 positions shown · non-contrast
Comparison: none

[Series 1: us ob follow up · 59 acquisitions, 12 frames shown]
[im 3/59]
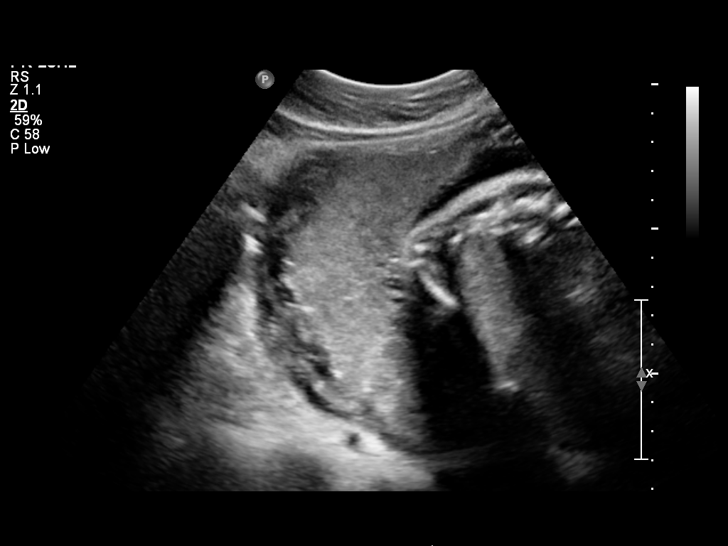
[im 7/59]
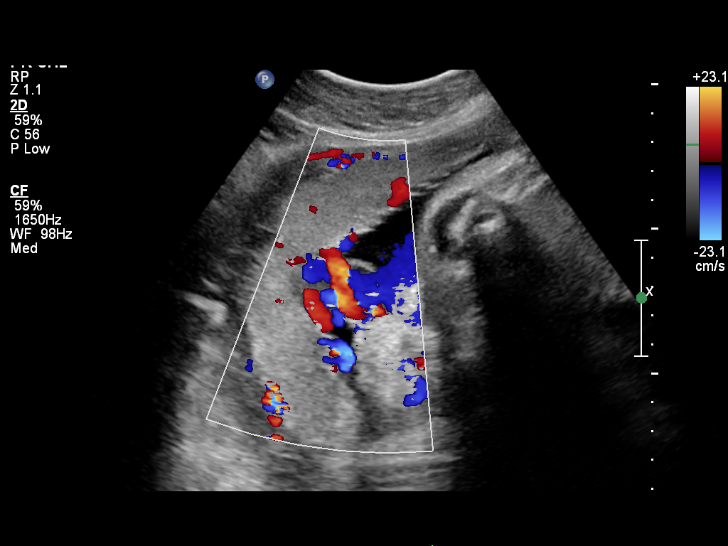
[im 11/59]
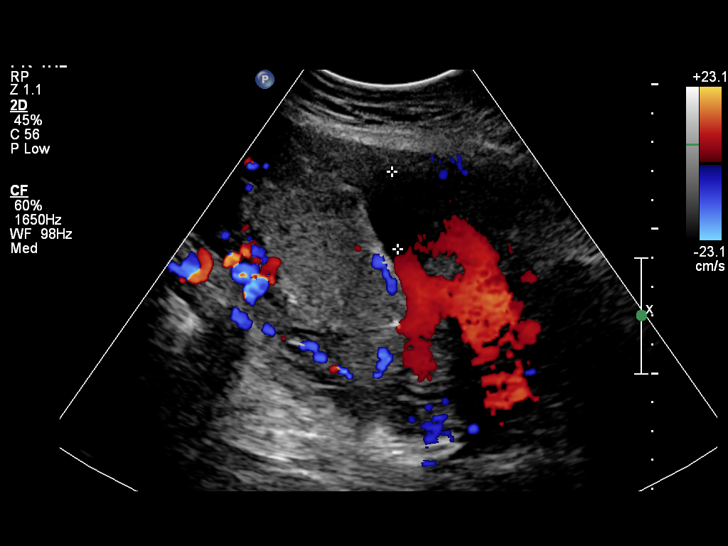
[im 18/59]
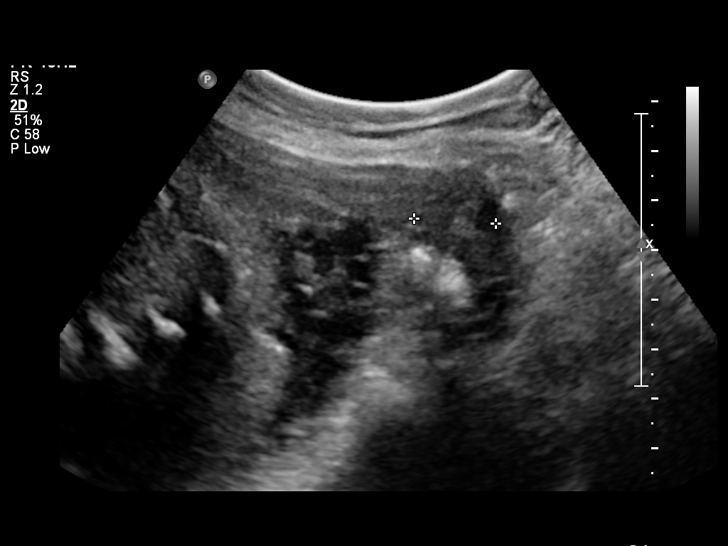
[im 22/59]
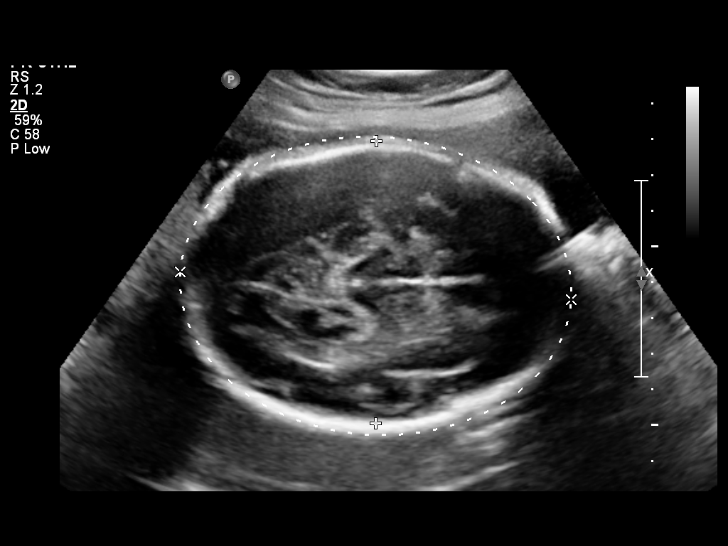
[im 26/59]
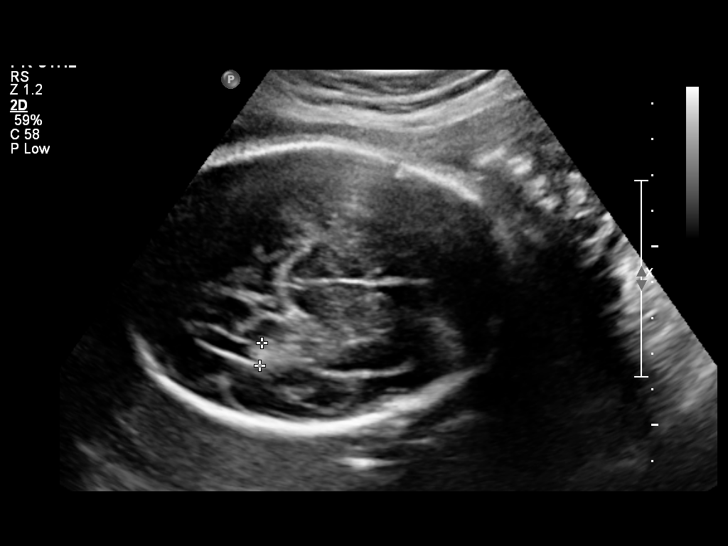
[im 33/59]
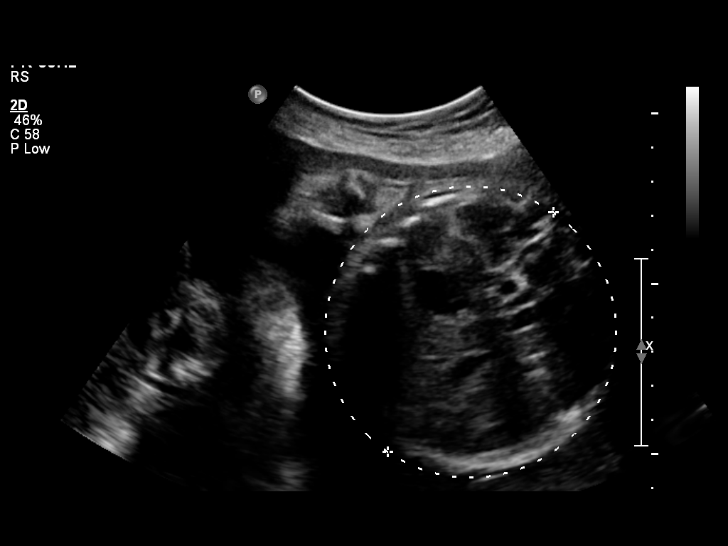
[im 37/59]
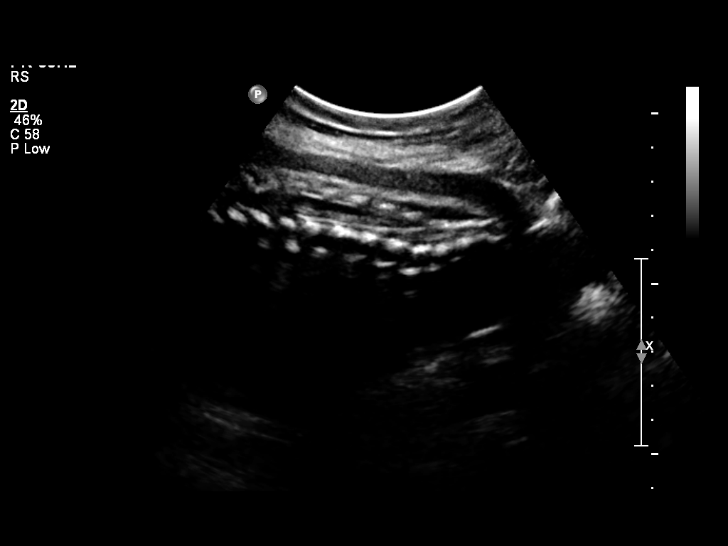
[im 41/59]
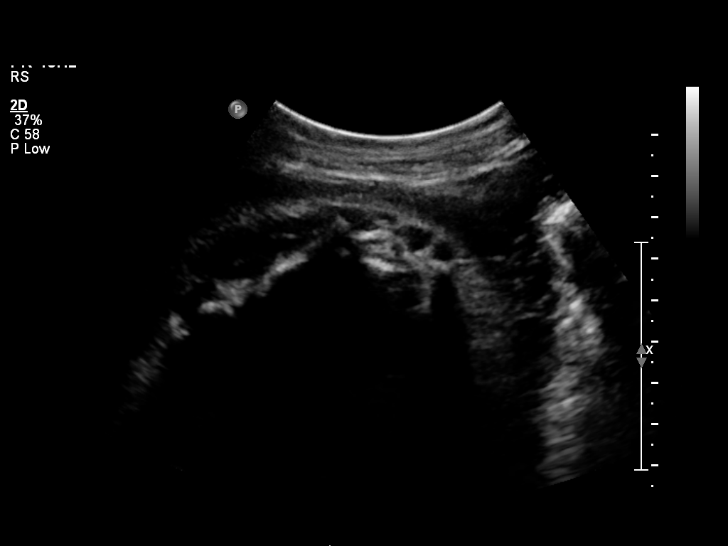
[im 48/59]
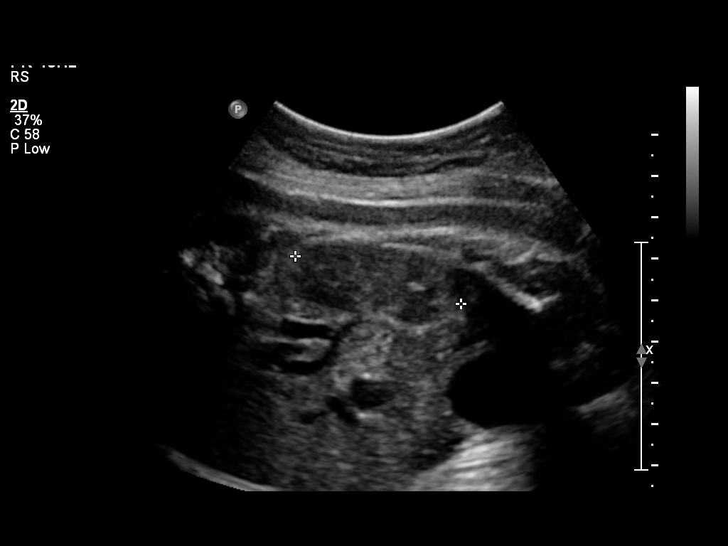
[im 52/59]
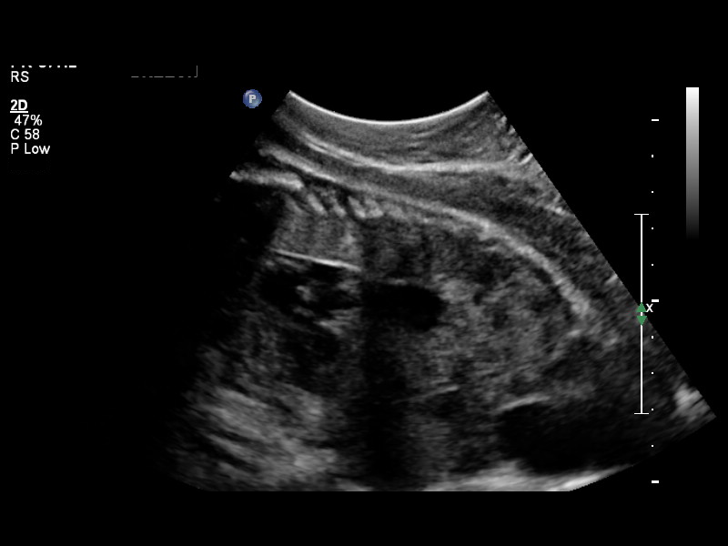
[im 56/59]
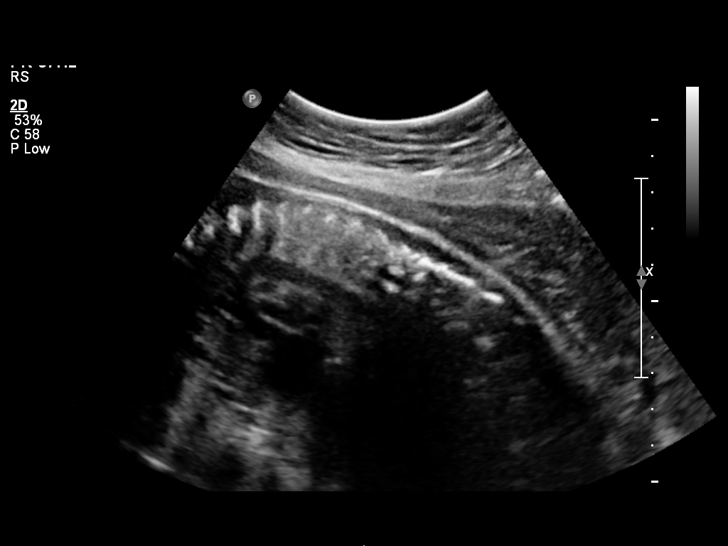

[12 of 28 positions shown; findings below may reference images not displayed]

OBSTETRICS REPORT
                      (Signed Final 09/24/2011 [DATE])

                 PA
 Order#:         [id number]
Procedures

 US OB FOLLOW UP                                       76816.1
Indications

 No or Little Prenatal Care
 Assess Fetal Growth / Estimated Fetal Weight
 Follow-up incomplete fetal anatomic evaluation
Fetal Evaluation

 Fetal Heart Rate:  153                         bpm
 Cardiac Activity:  Observed
 Presentation:      Breech
 Placenta:          Fundal, above cervical os
 P. Cord            Visualized
 Insertion:

 Amniotic Fluid
 AFI FV:      Subjectively within normal limits
 AFI Sum:     10.71   cm      24   %Tile     Larg Pckt:   3.61   cm
 RUQ:   1.28   cm    RLQ:    2.68   cm    LUQ:   3.61    cm   LLQ:    3.14   cm
Biometry

 BPD:       79  mm    G. Age:   31w 5d                CI:        68.01   70 - 86
                                                      FL/HC:      20.4   19.4 -

 HC:     306.4  mm    G. Age:   34w 1d       28  %    HC/AC:      1.13   0.96 -

 AC:     271.7  mm    G. Age:   31w 2d        5  %    FL/BPD:     79.0   71 - 87
 FL:      62.4  mm    G. Age:   32w 2d       13  %    FL/AC:      23.0   20 - 24

 Est. FW:    9939  gm      4 lb 2 oz     28  %
Gestational Age

 Clinical EDD:  33w 4d                                        EDD:   11/08/11
 U/S Today:     32w 3d                                        EDD:   11/16/11
 Best:          33w 4d    Det. By:   Clinical EDD             EDD:   11/08/11
Anatomy
 Cranium:           Appears normal      Aortic Arch:       Appears normal
 Fetal Cavum:       Appears normal      Ductal Arch:       Appears normal
 Ventricles:        Appears normal      Diaphragm:         Appears normal
 Choroid Plexus:    Previously seen     Stomach:           Appears
                                                           normal, left
                                                           sided
 Cerebellum:        Previously seen     Abdomen:           Previously seen
 Posterior Fossa:   Previously seen     Abdominal Wall:    Not well
                                                           visualized
 Nuchal Fold:       Not applicable      Cord Vessels:      Previously seen
                    (< 16 wks GA)
 Face:              Previously seen     Kidneys:           Appear normal
 Heart:             Not well            Bladder:           Appears normal
                    visualized
                    today. Seen
                    previously.
 RVOT:              Appears normal      Spine:             Appears normal
 LVOT:              Not well            Limbs:             Previously seen
                    visualized

 Other:     Heels and 5th digit prev. visualized. Fetus appears to be
            a male. Technically difficult due to advanced GA and
            fetal position.
Cervix Uterus Adnexa

 Cervical Length:   3.28      cm

 Cervix:       Normal appearance by transabdominal scan.
 Left Ovary:   Within normal limits.
 Right Ovary:  Not visualized.
 Adnexa:     No abnormality visualized.
Impression

 Single intrauterine gestation demonstrating an estimated
 gestational age by ultrasound of 32w 3d. This is correlated
 with expected estimated gestational age by  clinical EDD of
 33w 4d. EFW is currently at the 28% and this is stable
 incomparison with the prior exam at 29 weeks. Today the AC
 is measuring at the 5% and continued close follow up for
 growth is recommended to exclude developing asymmetric
 IUGR .

 No late developing fetal anatomic abnormalities are noted
 associated with the lateral ventricles,stomach, kidneys or
 bladder. The four chamber heart could not be well assessed
 due to fetal positioning combined.

 Subjectively and quantitatively normal amniotic fluid volume.

 Normal cervical length and appearance.

 questions or concerns.

## 2014-04-12 ENCOUNTER — Emergency Department (HOSPITAL_COMMUNITY)
Admission: EM | Admit: 2014-04-12 | Discharge: 2014-04-12 | Disposition: A | Discharge status: Home / Self Care | Payer: Medicaid Other | Source: Home / Self Care | Attending: Family Medicine | Admitting: Family Medicine

## 2014-04-12 ENCOUNTER — Encounter (HOSPITAL_COMMUNITY): Payer: Self-pay | Admitting: Emergency Medicine

## 2014-04-12 DIAGNOSIS — S39012A Strain of muscle, fascia and tendon of lower back, initial encounter: Secondary | ICD-10-CM

## 2014-04-12 LAB — POCT URINALYSIS DIP (DEVICE)
BILIRUBIN URINE: NEGATIVE
GLUCOSE, UA: NEGATIVE mg/dL
Hgb urine dipstick: NEGATIVE
KETONES UR: NEGATIVE mg/dL
LEUKOCYTES UA: NEGATIVE
Nitrite: NEGATIVE
PROTEIN: NEGATIVE mg/dL
Urobilinogen, UA: 0.2 mg/dL (ref 0.0–1.0)
pH: 6.5 (ref 5.0–8.0)

## 2014-04-12 LAB — POCT PREGNANCY, URINE: PREG TEST UR: NEGATIVE

## 2014-04-12 MED ORDER — IBUPROFEN 800 MG PO TABS: 800.0000 mg | ORAL_TABLET | Freq: Three times a day (TID) | ORAL | Status: DC

## 2014-04-12 MED ORDER — METHOCARBAMOL 500 MG PO TABS: 500.0000 mg | ORAL_TABLET | Freq: Two times a day (BID) | ORAL | Status: DC

## 2014-04-12 NOTE — ED Notes (Signed)
C/o pain in back, worse w CVJ percussion, pain w urination; febrile

## 2014-04-12 NOTE — ED Provider Notes (Signed)
CSN: [id number]     Arrival date & time 04/12/14  [id number] History   First MD Initiated Contact with Patient 04/12/14 1015     Chief Complaint  Patient presents with  . Back Pain   (Consider location/radiation/quality/duration/timing/severity/associated sxs/prior Treatment) Patient is a 33 y.o. female presenting with back pain. The history is provided by the patient and the spouse.  Back Pain Location:  Lumbar spine Quality:  Stiffness Radiates to:  Does not radiate Pain severity:  Mild Onset quality:  Gradual Progression:  Unchanged Chronicity:  New Context: lifting heavy objects   Context comment:  Lifting baby makes back sore,   Past Medical History  Diagnosis Date  . Hearing deficit   . Speech and language deficits     speaks no English/difficult speech/ extremely hard of hearing   Past Surgical History  Procedure Laterality Date  . No past surgeries    . Laparoscopic tubal ligation  03/07/2012    Procedure: LAPAROSCOPIC TUBAL LIGATION;  Surgeon: Allie BossierMyra C Dove, MD;  Location: [facility];  Service: Gynecology;  Laterality: Bilateral;   Family History  Problem Relation Age of Onset  . Anesthesia problems Neg Hx    History  Substance Use Topics  . Smoking status: Never Smoker   . Smokeless tobacco: Never Used  . Alcohol Use: No   OB History   Grav Para Term Preterm Abortions TAB SAB Ect Mult Living   3 3 3  0 0 0 0 0 0 3     Review of Systems  Gastrointestinal: Negative.   Genitourinary: Negative.   Musculoskeletal: Positive for back pain.    Allergies  Review of patient's allergies indicates no known allergies.  Home Medications   Prior to Admission medications   Medication Sig Start Date End Date Taking? Authorizing Provider  ibuprofen (ADVIL,MOTRIN) 800 MG tablet Take 1 tablet (800 mg total) by mouth 3 (three) times daily. For back pain 04/12/14   Linna HoffJames D Kindl, MD  methocarbamol (ROBAXIN) 500 MG tablet Take 1 tablet (500 mg total) by mouth 2 (two) times daily.  As muscle relaxer 04/12/14   Linna HoffJames D Kindl, MD   BP 117/77  Pulse 81  Temp(Src) 99.1 F (37.3 C) (Oral)  Resp 12  SpO2 100% Physical Exam  Nursing note and vitals reviewed. Constitutional: She is oriented to person, place, and time. She appears well-developed and well-nourished.  Abdominal: Soft. Bowel sounds are normal. There is no tenderness.  Musculoskeletal: She exhibits tenderness.       Lumbar back: She exhibits decreased range of motion, tenderness, pain and spasm. She exhibits no bony tenderness and normal pulse.  Neurological: She is alert and oriented to person, place, and time.  Skin: Skin is warm and dry.    ED Course  Procedures (including critical care time) Labs Review Labs Reviewed  POCT URINALYSIS DIP (DEVICE)  POCT PREGNANCY, URINE    Imaging Review No results found.   MDM   1. Low back strain, initial encounter        Linna HoffJames D Kindl, MD 04/12/14 1121

## 2014-04-29 ENCOUNTER — Encounter (HOSPITAL_COMMUNITY): Payer: Self-pay | Admitting: Emergency Medicine

## 2014-07-08 ENCOUNTER — Ambulatory Visit (INDEPENDENT_AMBULATORY_CARE_PROVIDER_SITE_OTHER): Payer: Medicaid Other | Admitting: Family Medicine

## 2014-07-08 ENCOUNTER — Encounter: Payer: Self-pay | Admitting: Family Medicine

## 2014-07-08 ENCOUNTER — Ambulatory Visit (INDEPENDENT_AMBULATORY_CARE_PROVIDER_SITE_OTHER): Payer: Medicaid Other | Admitting: *Deleted

## 2014-07-08 VITALS — BP 108/62 | HR 72 | Temp 97.9°F | Ht 63.0 in | Wt 105.6 lb

## 2014-07-08 DIAGNOSIS — M549 Dorsalgia, unspecified: Secondary | ICD-10-CM

## 2014-07-08 DIAGNOSIS — Z603 Acculturation difficulty: Secondary | ICD-10-CM | POA: Insufficient documentation

## 2014-07-08 DIAGNOSIS — R479 Unspecified speech disturbances: Secondary | ICD-10-CM

## 2014-07-08 DIAGNOSIS — R634 Abnormal weight loss: Secondary | ICD-10-CM

## 2014-07-08 DIAGNOSIS — Z23 Encounter for immunization: Secondary | ICD-10-CM

## 2014-07-08 DIAGNOSIS — H9190 Unspecified hearing loss, unspecified ear: Secondary | ICD-10-CM | POA: Insufficient documentation

## 2014-07-08 DIAGNOSIS — F809 Developmental disorder of speech and language, unspecified: Secondary | ICD-10-CM

## 2014-07-08 DIAGNOSIS — G8929 Other chronic pain: Secondary | ICD-10-CM

## 2014-07-08 LAB — COMPREHENSIVE METABOLIC PANEL
ALT: 15 U/L (ref 0–35)
AST: 24 U/L (ref 0–37)
Albumin: 4.2 g/dL (ref 3.5–5.2)
Alkaline Phosphatase: 77 U/L (ref 39–117)
BILIRUBIN TOTAL: 0.3 mg/dL (ref 0.2–1.2)
BUN: 11 mg/dL (ref 6–23)
CALCIUM: 9.2 mg/dL (ref 8.4–10.5)
CHLORIDE: 103 meq/L (ref 96–112)
CO2: 27 meq/L (ref 19–32)
CREATININE: 0.55 mg/dL (ref 0.50–1.10)
Glucose, Bld: 89 mg/dL (ref 70–99)
POTASSIUM: 4 meq/L (ref 3.5–5.3)
SODIUM: 138 meq/L (ref 135–145)
TOTAL PROTEIN: 7.3 g/dL (ref 6.0–8.3)

## 2014-07-08 LAB — CBC
HCT: 39.5 % (ref 36.0–46.0)
HEMOGLOBIN: 13.5 g/dL (ref 12.0–15.0)
MCH: 29.3 pg (ref 26.0–34.0)
MCHC: 34.2 g/dL (ref 30.0–36.0)
MCV: 85.9 fL (ref 78.0–100.0)
MPV: 10.8 fL (ref 8.6–12.4)
Platelets: 208 10*3/uL (ref 150–400)
RBC: 4.6 MIL/uL (ref 3.87–5.11)
RDW: 13.2 % (ref 11.5–15.5)
WBC: 5.8 10*3/uL (ref 4.0–10.5)

## 2014-07-08 MED ORDER — IBUPROFEN 600 MG PO TABS: 600.0000 mg | ORAL_TABLET | Freq: Four times a day (QID) | ORAL | Status: DC | PRN

## 2014-07-08 MED ORDER — METHOCARBAMOL 500 MG PO TABS
500.0000 mg | ORAL_TABLET | Freq: Every evening | ORAL | Status: AC | PRN
Start: 2014-07-08 — End: ?

## 2014-07-08 NOTE — Assessment & Plan Note (Addendum)
Recent worsening mid-low back pain, in setting chronic intermittent back pain. Similar to previous pain, most likely MSK etiology with muscle strain improved with conservative treatment. - well-appearing, pain worse T-L spine (some paraspinal pain), no radicular symptoms. No back pain red flags - No prior imaging of back  Plan: 1. Ordered Thoracic and Lumbar spine X-rays ([facility]) 2. Re-ordered Robaxin 500mg  qhs PRN pain 3. Re-ordered Ibuprofen 600mg  q 6 hr PRN pain 4. Advised heating pad, stretching, cont exercises avoid heavy lifting 5. RTC 2 weeks review X-rays

## 2014-07-08 NOTE — Patient Instructions (Signed)
We are going to get x-rays of your back.  Come back in 2-3 weeks to see Dr. Althea CharonKaramalegos to review this results.  Take the Ibuprofen for pain relief when you need it.    Take the Robaxin at night to help with tightness and spasm.  We will check bloodwork today because of your weight loss.    -----------------------------------------------------------------------  I DO NOT SPEAK ENGLISH WELL.    PLEASE DIRECT ME TO THE RADIOLOGY DEPARTMENT FOR AN X-RAY OF MY BACK AND HELP ME OBTAIN AN INTERPRETER.  I SPEAK THE NEPALESE LANGUAGE.

## 2014-07-08 NOTE — Progress Notes (Signed)
History provided by patient, interview conducted with assistance of Language Resources interpreter Adrian Prows) utilized during today's visit.  [facility] Patient Visit  Patient presents to [facility] today for a new patient appointment to establish general primary care.  HPI:  MID-LOW BACK PAIN: - Reports intermittent history of chronic recurrent flares of mid to low back pain for past 3 years while being in the [place]. States that recently had a flare in 03/2014, seen in Urgent Care per chart review, thought to be low back muscle strain at that time, given Robaxin and Ibuprofen with significant improvement. - Currently complains of return of low back pain x 2 months, gradual worsening without acute injury or accident. States mid-lower back pain, described as "pressure" non-radiating, constant, no longer any medications (ran out of previous rx). Pain worse with sitting up from laying down, otherwise unclear what makes it worse. Denies problems with lifting children or pain with exercising. Recently pregnant 2 years ago without worsening pain. - Admits to regular exercise daily, unclear if worsening pain - Denies any numbness, tingling, or weakness, pain or swelling in other joints  WEIGHT LOSS / POOR APPETITE: - Reports chronic history of low weight and being "thin". She states that she has always "not eaten much". Denies any specific reason for poor appetite. No recent change or worsening. Eats regular meals but "small amounts", mostly rice, lentils, fruits (grapes, apples). Interested in "medication to make her gain weight and get healthy". Currently weigh 105 lb, previously 115-120 lb about 3 years ago. - Admits to regular exercise with walking, every AM (reportedly wakes up at 5AM runs around neighborhood) - Denies any fevers/chills, abdominal pain, nausea / vomiting, constipation / diarrhea   ROS: See HPI  Immigrant Social History: - Date arrived in [place]: January 2012 - Country of  origin: [place] - Location of refugee camp (if applicable), how long there, and what caused patient to leave home country?: Refugee camp in [place], [place] for >20 years, was 34 years old and fled country of [place] with parents due to political conflict - Primary language: Nepali  - Requires intepreter (essentially speaks no [place]), has known hearing and speech impairment with some difficulty during interview with Nepali interpreter - Understands English well - Education: Highest level of education: No school in [place]. In [place] continues English classes. - Prior work: None - no employment in [place] - Lawyer use: none - Marriage Status: Married, has 3 children. - Social: Lives at home in [place] with husband and 3 children (33, 2, and 39 years old) - Contraception: S/p BTL 2013 - Plan to move to [place] to be with family in 4 months  Preventative Care History: -Seen at health department?: Yes, 4 years ago - Due for flu shot and MMR today  Past Medical Hx:  - No known PMH  Past Surgical Hx:  - C/S - BTL 2013  Family Hx: updated in Epic - Number of family members:  All family in [place] - Number of family members in [place]: Parents ([place]), 3 brothers and 2 sisters ([place])  PHYSICAL EXAM: BP 108/62 mmHg  Pulse 72  Temp(Src) 97.9 F (36.6 C) (Oral)  Ht 5' 3"  (1.6 m)  Wt 105 lb 9.6 oz (47.9 kg)  BMI 18.71 kg/m2 Gen: thin appearing, cooperative, NAD HEENT: NCAT, PERRL, EOMI, oropharynx clear, MMM Neck:  Supple, non-tender, no LAD, no thyromegaly Heart: RRR, no murmurs heard Lungs: CTAB. Normal work of breathing Abdomen: soft, NTND, no masses, +active BS Skin:  Warm, dry, no rashes. Left Lower Back: faint streak of discoloration from prior possible scar MSK: Back: tender to palpation directly over spinous process T to L-spine without localization, bilateral Lumbar paraspinal muscles tender to touch without obvious hypertonicity. Forward flexion without obvious scoliosis. Full active back  ROM (flex, ext, rotation). Seated and supine SLR negative w/o radicular symptoms. Neuro: awake, alert, oriented, grossly non-focal, intact muscle str 5/5 b/l grip, knee flex, plantar/dorsiflex, +2 DTR b/l patellar and achilles intact, intact distal sensation to light touch, gait normal Psych: well-groomed, appropriate mood, speech content normal  Examined and interviewed with Dr. Mingo Amber  FOLLOW UP: F/u in 2 weeks for review X-rays, labs for back pain and weight loss.

## 2014-07-08 NOTE — Assessment & Plan Note (Signed)
Gradual wt loss 15-20 lb in 3 years, in setting of chronic poor appetite and regular aerobic exercise. Likely protein-calorie malnutrition - No significant red flags  Plan: 1. Order CMET, CBC 2. RTC 2 weeks review results

## 2014-07-23 ENCOUNTER — Ambulatory Visit
Admission: RE | Admit: 2014-07-23 | Discharge: 2014-07-23 | Disposition: A | Discharge status: Home / Self Care | Payer: Medicaid Other | Source: Ambulatory Visit | Attending: Family Medicine | Admitting: Family Medicine

## 2014-07-23 DIAGNOSIS — M549 Dorsalgia, unspecified: Principal | ICD-10-CM

## 2014-07-23 DIAGNOSIS — G8929 Other chronic pain: Secondary | ICD-10-CM

## 2014-08-20 ENCOUNTER — Ambulatory Visit (INDEPENDENT_AMBULATORY_CARE_PROVIDER_SITE_OTHER): Payer: Medicaid Other | Admitting: Family Medicine

## 2014-08-20 ENCOUNTER — Encounter: Payer: Self-pay | Admitting: Family Medicine

## 2014-08-20 VITALS — BP 109/70 | HR 69 | Temp 97.9°F | Ht 63.0 in | Wt 104.5 lb

## 2014-08-20 DIAGNOSIS — M549 Dorsalgia, unspecified: Secondary | ICD-10-CM

## 2014-08-20 DIAGNOSIS — R634 Abnormal weight loss: Secondary | ICD-10-CM

## 2014-08-20 DIAGNOSIS — G8929 Other chronic pain: Secondary | ICD-10-CM

## 2014-08-20 DIAGNOSIS — Z23 Encounter for immunization: Secondary | ICD-10-CM

## 2014-08-20 DIAGNOSIS — Z603 Acculturation difficulty: Secondary | ICD-10-CM

## 2014-08-20 MED ORDER — IBUPROFEN 600 MG PO TABS: 600.0000 mg | ORAL_TABLET | Freq: Four times a day (QID) | ORAL | Status: AC | PRN

## 2014-08-20 NOTE — Assessment & Plan Note (Signed)
No concerning red flags.  Eating is better than before.   Still being very active.   FU if further weight loss concerns.

## 2014-08-20 NOTE — Patient Instructions (Signed)
Let me know if you need records before you go to [place].  I have printed more medicine for your back if you need it.    It was good to see you again today!

## 2014-08-20 NOTE — Assessment & Plan Note (Signed)
Hep A today

## 2014-08-20 NOTE — Progress Notes (Signed)
Subjective:    Samantha Garner is a 34 y.o. female who presents to [facility] today for FU from last visit:  1.  Back pain:  Much improved s/p Ibuprofen use.  No pain most days.  Able to complete daily activities at home.  She has 2 more pills left.    She is planning to move to [place] in June.    She has no other concerns or complaints today.  Weight loss has leveled off.  No cough.  Eating and drinking well. No night sweats.  No fevers or chills.    The following portions of the patient's history were reviewed and updated as appropriate: allergies, current medications, past medical history, family and social history, and problem list. Patient is a nonsmoker.    PMH reviewed.  Past Medical History  Diagnosis Date  . Hearing deficit   . Speech and language deficits     speaks no English/difficult speech/ extremely hard of hearing   Past Surgical History  Procedure Laterality Date  . No past surgeries    . Laparoscopic tubal ligation  03/07/2012    Procedure: LAPAROSCOPIC TUBAL LIGATION;  Surgeon: Allie BossierMyra C Dove, MD;  Location: [facility];  Service: Gynecology;  Laterality: Bilateral;    Medications reviewed. Current Outpatient Prescriptions  Medication Sig Dispense Refill  . ibuprofen (ADVIL,MOTRIN) 600 MG tablet Take 1 tablet (600 mg total) by mouth every 6 (six) hours as needed. For back pain 30 tablet 1  . methocarbamol (ROBAXIN) 500 MG tablet Take 1 tablet (500 mg total) by mouth at bedtime as needed for muscle spasms. As muscle relaxer 30 tablet 1   No current facility-administered medications for this visit.     Objective:   Physical Exam BP 109/70 mmHg  Pulse 69  Temp(Src) 97.9 F (36.6 C) (Oral)  Ht 5\' 3"  (1.6 m)  Wt 104 lb 8 oz (47.401 kg)  BMI 18.52 kg/m2 Gen:  Alert, cooperative patient who appears stated age in no acute distress.  Vital signs reviewed. Cardiac:  Regular rate and rhythm without murmur auscultated.  Good S1/S2. Pulm:  Clear to auscultation bilaterally with  good air movement.  No wheezes or rales noted.   Back:  Normal skin, Spine with normal alignment and no deformity.  No tenderness to vertebral process palpation.  Paraspinous muscles are not tender and without spasm.   Range of motion is full at neck and lumbar sacral regions.  Straight leg raise is negative Neuro:  Sensation and motor function 5/5 bilateral lower extremities.  Patellar and Achilles  DTR's +2 patellar BL.  He is able to walk on his heels and toes without difficulty.      No results found for this or any previous visit (from the past 72 hour(s)).

## 2014-08-20 NOTE — Assessment & Plan Note (Signed)
Much improved.   Refill for Ibuprofen PRN.

## 2014-10-08 ENCOUNTER — Encounter (INDEPENDENT_AMBULATORY_CARE_PROVIDER_SITE_OTHER): Payer: Medicaid Other | Admitting: Medical
